# Patient Record
Sex: Male | Born: 1935 | Race: White | Hispanic: No
Health system: Southern US, Community
[De-identification: ages and names within clinical notes are randomized; demographics above are authoritative.]

---

## 2004-11-15 ENCOUNTER — Ambulatory Visit: Payer: Self-pay | Admitting: Internal Medicine

## 2004-11-23 ENCOUNTER — Ambulatory Visit: Payer: Self-pay | Admitting: Internal Medicine

## 2004-12-24 ENCOUNTER — Ambulatory Visit: Payer: Self-pay | Admitting: Internal Medicine

## 2005-01-23 ENCOUNTER — Ambulatory Visit: Payer: Self-pay | Admitting: Internal Medicine

## 2008-07-29 ENCOUNTER — Encounter: Payer: Self-pay | Admitting: Internal Medicine

## 2008-08-25 ENCOUNTER — Encounter: Payer: Self-pay | Admitting: Internal Medicine

## 2009-06-02 ENCOUNTER — Ambulatory Visit: Payer: Self-pay | Admitting: Internal Medicine

## 2009-11-19 ENCOUNTER — Inpatient Hospital Stay: Payer: Self-pay | Admitting: Internal Medicine

## 2009-12-22 ENCOUNTER — Inpatient Hospital Stay: Payer: Self-pay | Admitting: Internal Medicine

## 2010-01-24 ENCOUNTER — Inpatient Hospital Stay: Payer: Self-pay | Admitting: Internal Medicine

## 2010-02-12 ENCOUNTER — Inpatient Hospital Stay: Payer: Self-pay | Admitting: Internal Medicine

## 2011-02-09 IMAGING — CR DG CHEST 1V PORT
1 series · 1 of 1 positions shown · non-contrast
Comparison: none

REASON FOR EXAM: sob
COMMENTS:

PROCEDURE:     DXR - DXR PORTABLE CHEST SINGLE VIEW  - February 12, 2010  [DATE]
RESULT:     Comparison: 01/27/2010

[view not recorded]
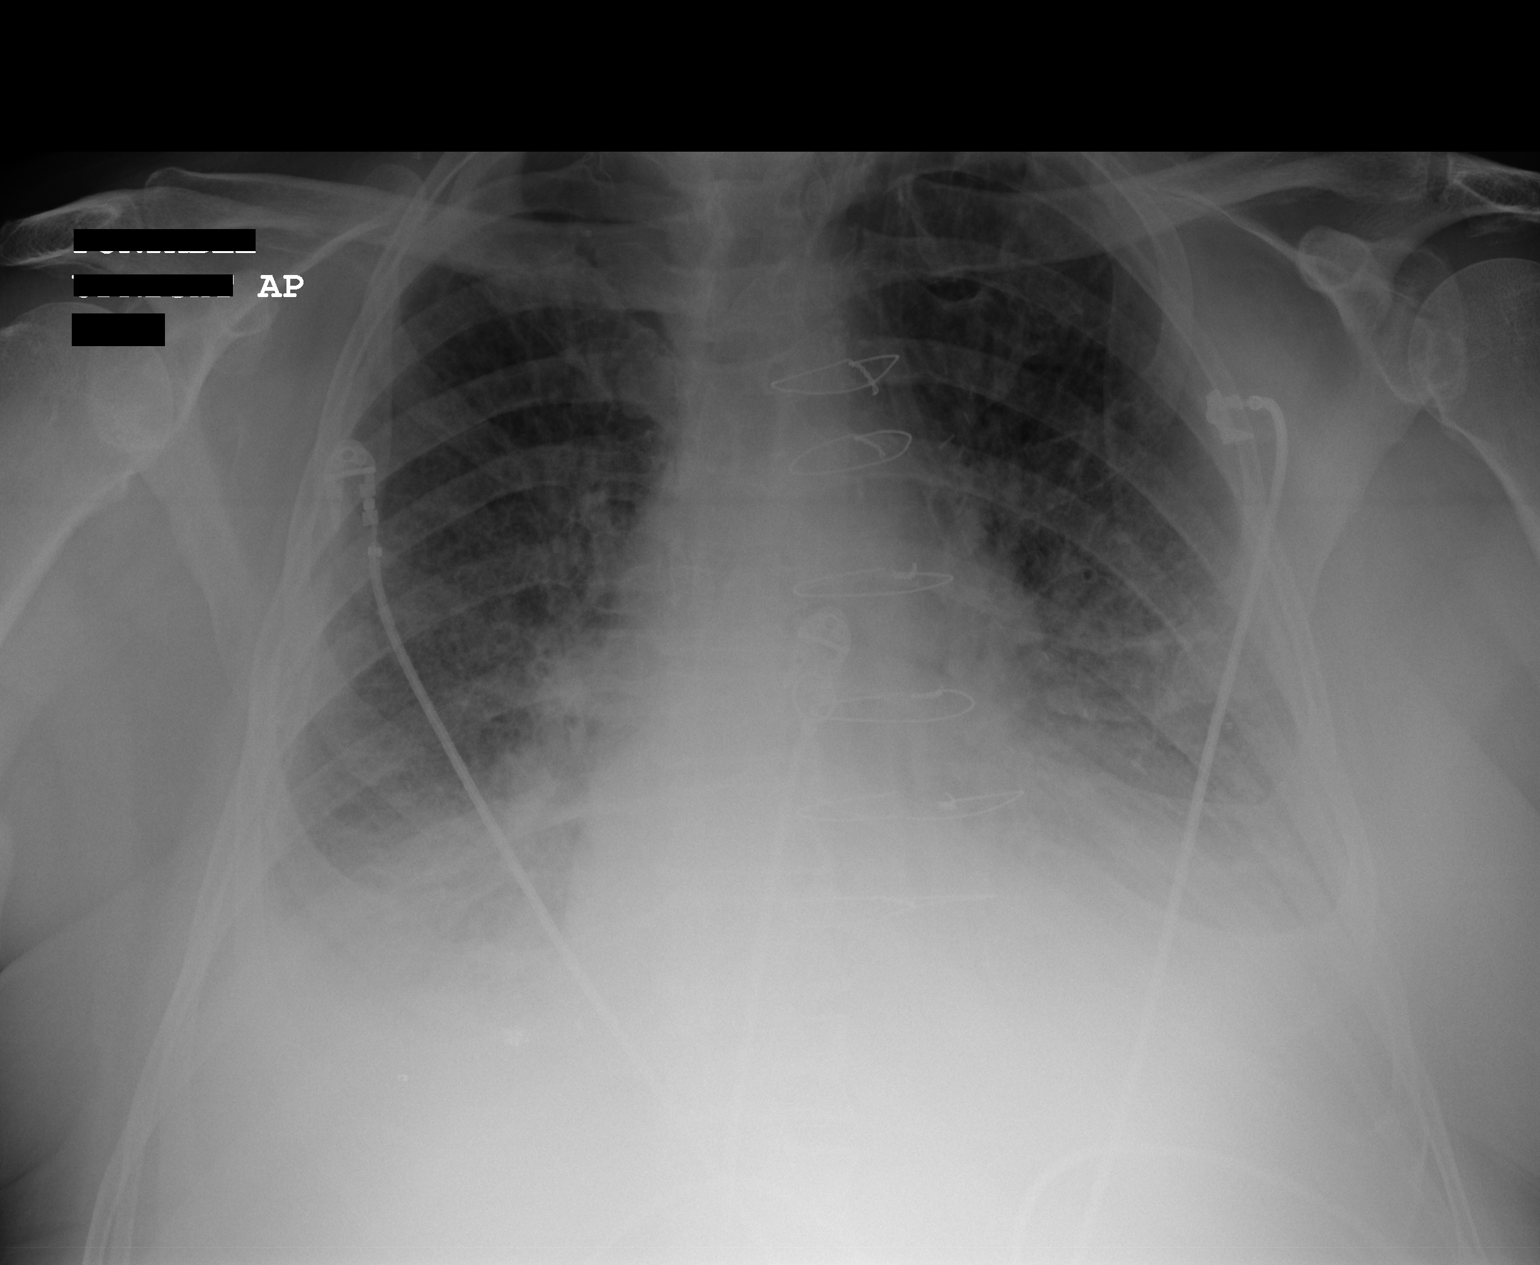

[1 of 1 positions shown; findings below may reference images not displayed]

FINDINGS: Single portable AP chest radiograph is provided. There bilateral small
pleural effusions. There is bilateral interstitial thickening and
indistinctness of the central pulmonary vasculature. The heart size is
enlarged. There is evidence of prior CABG. The osseous structures are
unremarkable.
IMPRESSION: The overall findings are concerning for pulmonary edema.

## 2011-02-10 IMAGING — CR DG CHEST 2V
1 series · 3 of 3 positions shown · non-contrast
Comparison: none

REASON FOR EXAM: CHF
COMMENTS:

[Series 1: view not recorded · 0.17mm/px · 3 of 3 slices shown]
[im 1/3]
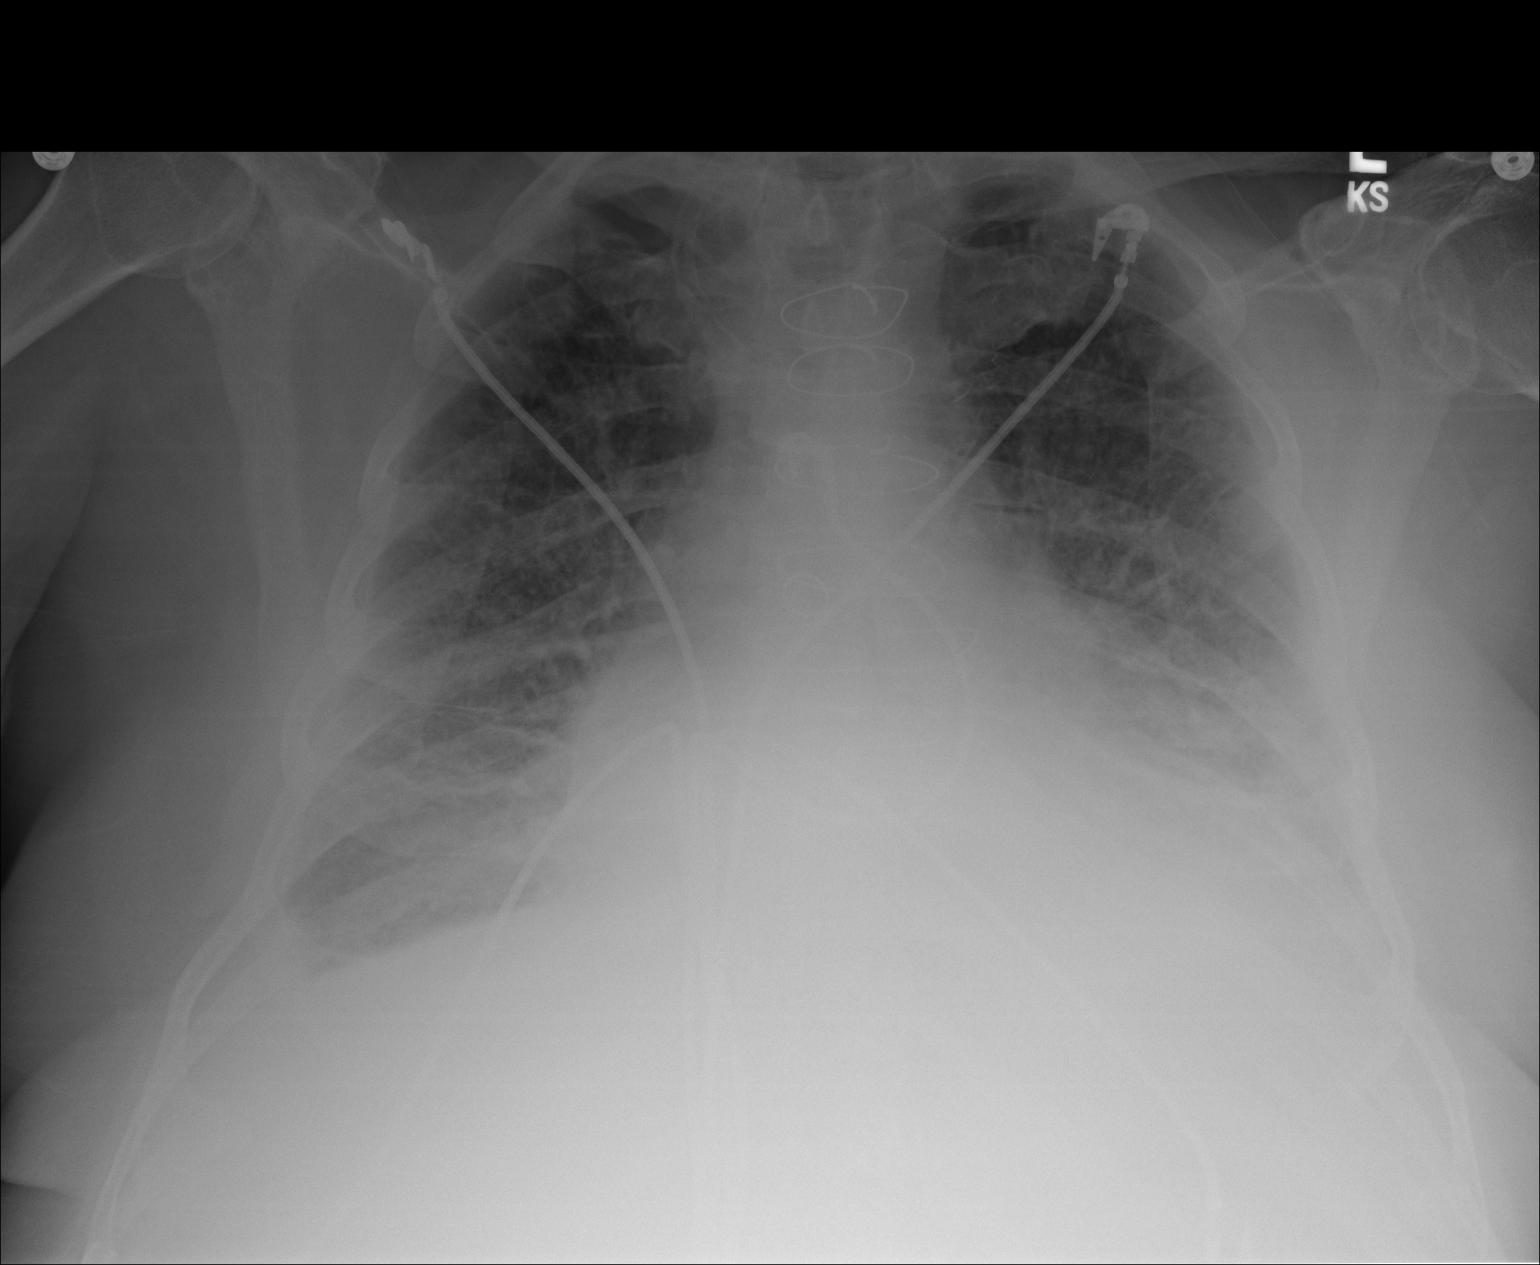
[im 2/3]
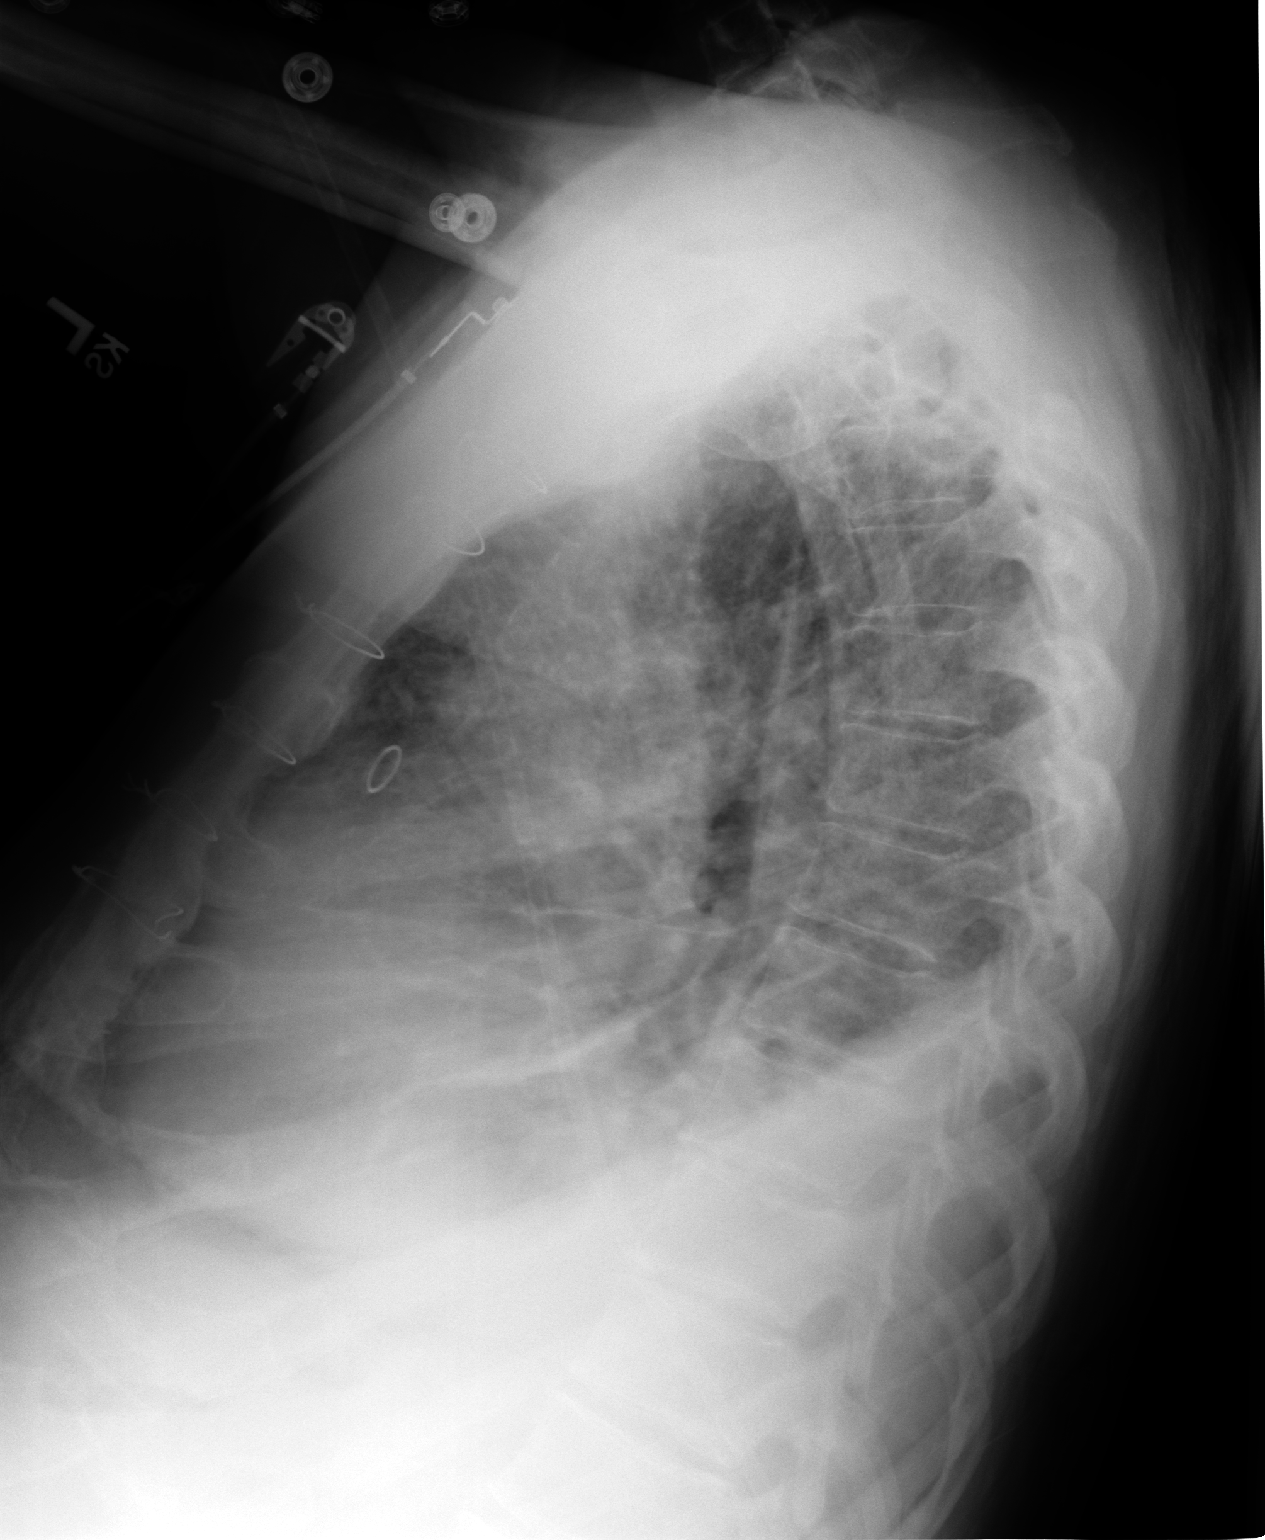
[im 3/3]
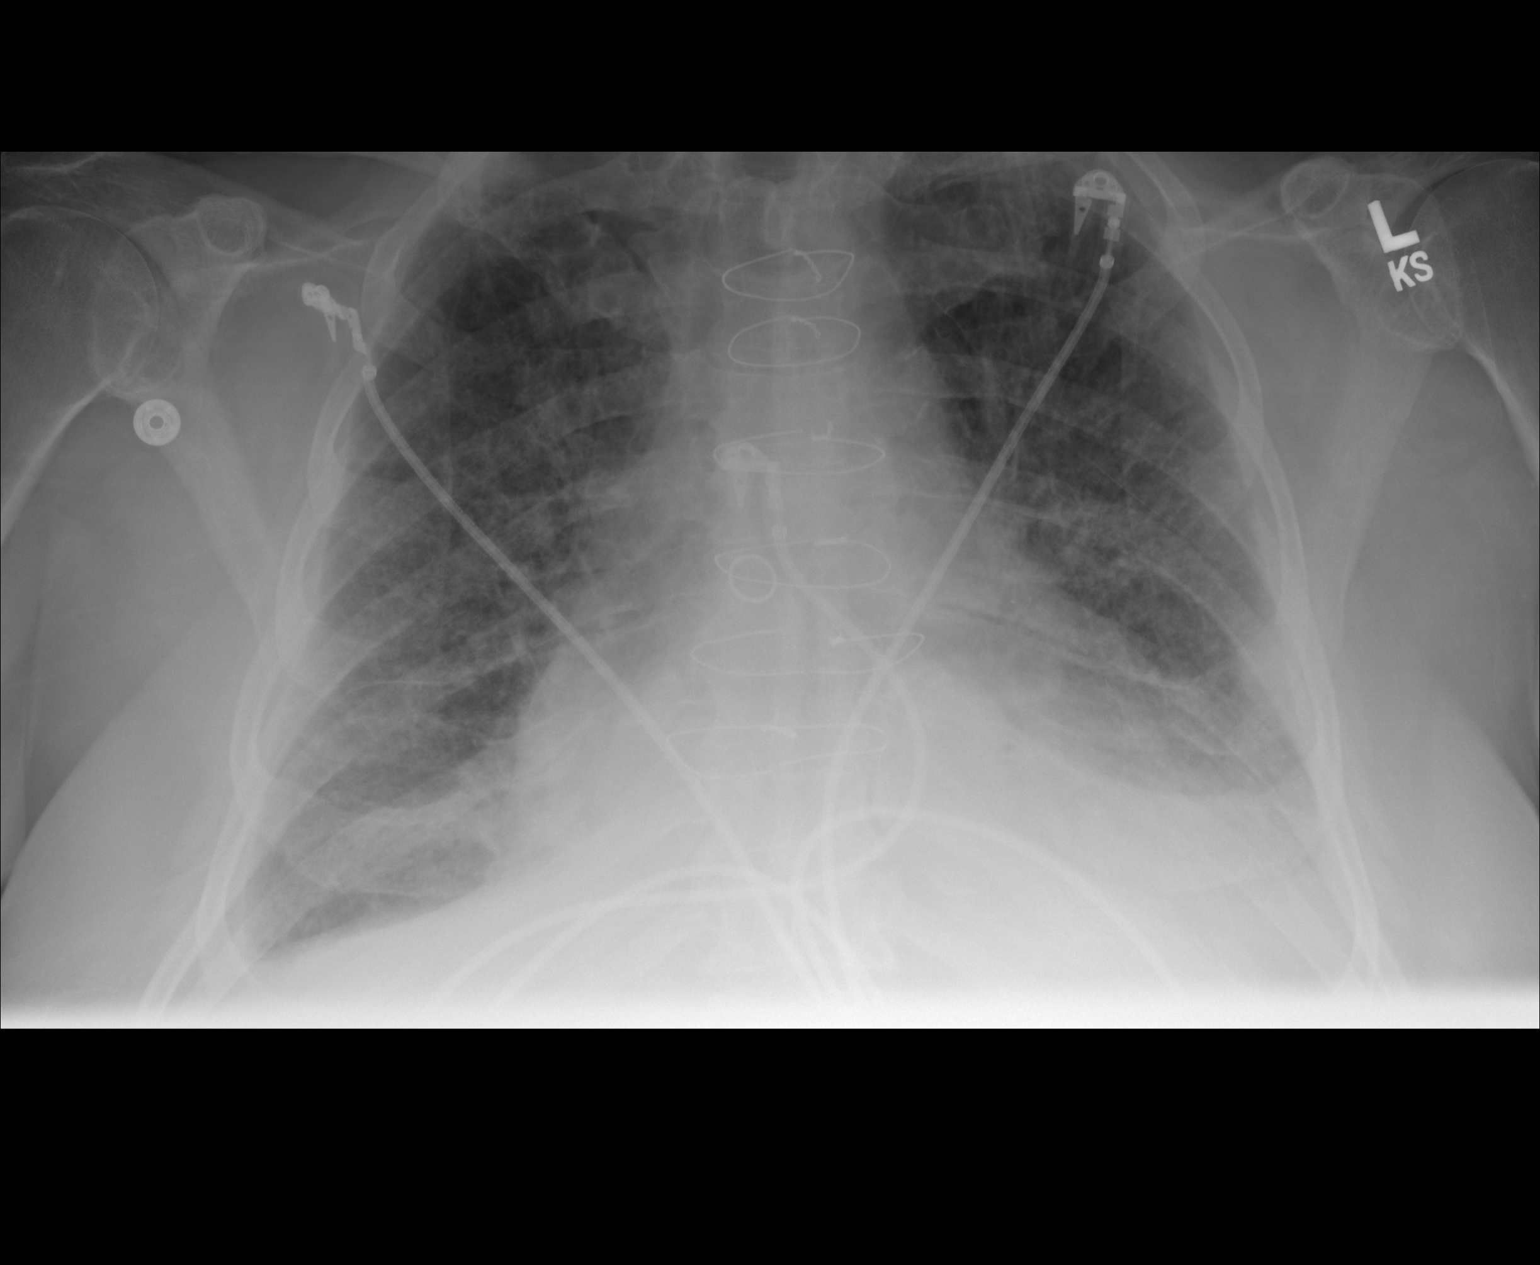

[3 of 3 positions shown; findings below may reference images not displayed]

PROCEDURE:     DXR - DXR CHEST PA (OR AP) AND LATERAL  - February 13, 2010  [DATE]

RESULT:     Comparison is made to study 13 February, 1999 the 11.

The cardiac silhouette is enlarged in its margins indistinct. The pulmonary
vascularity is engorged and indistinct and the interstitial markings are
increased. There is partial obscuration of the hemidiaphragms.
IMPRESSION: The findings are consistent with acute pulmonary
interstitial edema secondary to CHF. There has been some deterioration since
yesterday's study. There are pleural effusions layering posteriorly.

## 2013-01-31 ENCOUNTER — Inpatient Hospital Stay: Payer: Self-pay | Admitting: Internal Medicine

## 2013-01-31 LAB — CBC
HCT: 30.7 % — ABNORMAL LOW (ref 40.0–52.0)
HGB: 10.7 g/dL — ABNORMAL LOW (ref 13.0–18.0)
MCHC: 34.8 g/dL (ref 32.0–36.0)
RBC: 3.19 10*6/uL — ABNORMAL LOW (ref 4.40–5.90)
RDW: 15.3 % — ABNORMAL HIGH (ref 11.5–14.5)
WBC: 15.9 10*3/uL — ABNORMAL HIGH (ref 3.8–10.6)

## 2013-01-31 LAB — COMPREHENSIVE METABOLIC PANEL
Albumin: 3.4 g/dL (ref 3.4–5.0)
BUN: 45 mg/dL — ABNORMAL HIGH (ref 7–18)
Bilirubin,Total: 0.5 mg/dL (ref 0.2–1.0)
Calcium, Total: 8.5 mg/dL (ref 8.5–10.1)
Chloride: 94 mmol/L — ABNORMAL LOW (ref 98–107)
Co2: 21 mmol/L (ref 21–32)
Creatinine: 2.16 mg/dL — ABNORMAL HIGH (ref 0.60–1.30)
EGFR (Non-African Amer.): 28 — ABNORMAL LOW
Glucose: 137 mg/dL — ABNORMAL HIGH (ref 65–99)
Osmolality: 265 (ref 275–301)
SGPT (ALT): 30 U/L (ref 12–78)
Sodium: 125 mmol/L — ABNORMAL LOW (ref 136–145)
Total Protein: 7.7 g/dL (ref 6.4–8.2)

## 2013-01-31 LAB — URINALYSIS, COMPLETE
Blood: NEGATIVE
Glucose,UR: NEGATIVE mg/dL (ref 0–75)
Hyaline Cast: 22
Ketone: NEGATIVE
Nitrite: NEGATIVE
Ph: 8 (ref 4.5–8.0)
RBC,UR: 1 /HPF (ref 0–5)
Specific Gravity: 1.012 (ref 1.003–1.030)
Squamous Epithelial: NONE SEEN
WBC UR: 5 /HPF (ref 0–5)

## 2013-01-31 LAB — CK TOTAL AND CKMB (NOT AT ARMC)
CK, Total: 86 U/L (ref 35–232)
CK-MB: 1.9 ng/mL (ref 0.5–3.6)

## 2013-01-31 LAB — APTT: Activated PTT: 41.1 secs — ABNORMAL HIGH (ref 23.6–35.9)

## 2013-01-31 LAB — TROPONIN I: Troponin-I: 0.88 ng/mL — ABNORMAL HIGH

## 2013-02-01 LAB — LIPID PANEL
HDL Cholesterol: 42 mg/dL (ref 40–60)
Ldl Cholesterol, Calc: 59 mg/dL (ref 0–100)
Triglycerides: 74 mg/dL (ref 0–200)
VLDL Cholesterol, Calc: 15 mg/dL (ref 5–40)

## 2013-02-01 LAB — CBC WITH DIFFERENTIAL/PLATELET
Basophil %: 0.2 %
Eosinophil #: 0.1 10*3/uL (ref 0.0–0.7)
Eosinophil %: 0.5 %
HGB: 10.5 g/dL — ABNORMAL LOW (ref 13.0–18.0)
Lymphocyte #: 1.4 10*3/uL (ref 1.0–3.6)
MCH: 34.2 pg — ABNORMAL HIGH (ref 26.0–34.0)
MCHC: 35.5 g/dL (ref 32.0–36.0)
MCV: 96 fL (ref 80–100)
Monocyte #: 1.3 x10 3/mm — ABNORMAL HIGH (ref 0.2–1.0)
Monocyte %: 9 %
Neutrophil #: 11.6 10*3/uL — ABNORMAL HIGH (ref 1.4–6.5)
Neutrophil %: 80.4 %
Platelet: 162 10*3/uL (ref 150–440)
RBC: 3.07 10*6/uL — ABNORMAL LOW (ref 4.40–5.90)
RDW: 15.5 % — ABNORMAL HIGH (ref 11.5–14.5)
WBC: 14.4 10*3/uL — ABNORMAL HIGH (ref 3.8–10.6)

## 2013-02-01 LAB — BASIC METABOLIC PANEL
Anion Gap: 7 (ref 7–16)
BUN: 47 mg/dL — ABNORMAL HIGH (ref 7–18)
Chloride: 96 mmol/L — ABNORMAL LOW (ref 98–107)
Co2: 23 mmol/L (ref 21–32)
EGFR (Non-African Amer.): 30 — ABNORMAL LOW
Osmolality: 268 (ref 275–301)
Sodium: 126 mmol/L — ABNORMAL LOW (ref 136–145)

## 2013-02-01 LAB — TROPONIN I
Troponin-I: 1.8 ng/mL — ABNORMAL HIGH
Troponin-I: 1.5 ng/mL — ABNORMAL HIGH
Troponin-I: 1.1 ng/mL — ABNORMAL HIGH

## 2013-02-01 LAB — MAGNESIUM: Magnesium: 2.1 mg/dL

## 2013-02-01 LAB — TSH: Thyroid Stimulating Horm: 3.86 u[IU]/mL

## 2013-02-02 LAB — BASIC METABOLIC PANEL
Anion Gap: 7 (ref 7–16)
BUN: 40 mg/dL — ABNORMAL HIGH (ref 7–18)
Calcium, Total: 8.2 mg/dL — ABNORMAL LOW (ref 8.5–10.1)
Chloride: 102 mmol/L (ref 98–107)
Co2: 23 mmol/L (ref 21–32)
Creatinine: 1.74 mg/dL — ABNORMAL HIGH (ref 0.60–1.30)
EGFR (African American): 43 — ABNORMAL LOW
EGFR (Non-African Amer.): 37 — ABNORMAL LOW
Glucose: 152 mg/dL — ABNORMAL HIGH (ref 65–99)
Osmolality: 277 (ref 275–301)
Potassium: 4.4 mmol/L (ref 3.5–5.1)
Sodium: 132 mmol/L — ABNORMAL LOW (ref 136–145)

## 2013-02-02 LAB — CBC WITH DIFFERENTIAL/PLATELET
Basophil #: 0 10*3/uL (ref 0.0–0.1)
Eosinophil #: 0.1 10*3/uL (ref 0.0–0.7)
Eosinophil %: 1 %
HCT: 30.4 % — ABNORMAL LOW (ref 40.0–52.0)
Lymphocyte #: 1.2 10*3/uL (ref 1.0–3.6)
Lymphocyte %: 10.5 %
MCH: 34.1 pg — ABNORMAL HIGH (ref 26.0–34.0)
MCHC: 35.2 g/dL (ref 32.0–36.0)
MCV: 97 fL (ref 80–100)
Monocyte #: 1 x10 3/mm (ref 0.2–1.0)
Monocyte %: 8.5 %
Neutrophil #: 9.3 10*3/uL — ABNORMAL HIGH (ref 1.4–6.5)
Neutrophil %: 79.7 %
Platelet: 162 10*3/uL (ref 150–440)
RBC: 3.14 10*6/uL — ABNORMAL LOW (ref 4.40–5.90)
WBC: 11.7 10*3/uL — ABNORMAL HIGH (ref 3.8–10.6)

## 2013-02-06 LAB — CULTURE, BLOOD (SINGLE)

## 2013-02-23 ENCOUNTER — Ambulatory Visit: Payer: Self-pay | Admitting: Internal Medicine

## 2013-02-25 LAB — CBC
HCT: 31.7 % — ABNORMAL LOW (ref 40.0–52.0)
HGB: 10.9 g/dL — ABNORMAL LOW (ref 13.0–18.0)
MCH: 33.3 pg (ref 26.0–34.0)
MCHC: 34.3 g/dL (ref 32.0–36.0)
MCV: 97 fL (ref 80–100)
Platelet: 176 10*3/uL (ref 150–440)
RDW: 15.8 % — ABNORMAL HIGH (ref 11.5–14.5)
WBC: 9.7 10*3/uL (ref 3.8–10.6)

## 2013-02-25 LAB — PRO B NATRIURETIC PEPTIDE: B-Type Natriuretic Peptide: 13760 pg/mL — ABNORMAL HIGH (ref 0–450)

## 2013-02-25 LAB — URINALYSIS, COMPLETE
Bilirubin,UR: NEGATIVE
Glucose,UR: NEGATIVE mg/dL (ref 0–75)
Ketone: NEGATIVE
Protein: NEGATIVE
RBC,UR: <1 /HPF (ref 0–5)
Squamous Epithelial: <1

## 2013-02-25 LAB — CK TOTAL AND CKMB (NOT AT ARMC): CK, Total: 43 U/L (ref 35–232)

## 2013-02-25 LAB — COMPREHENSIVE METABOLIC PANEL
Albumin: 3.4 g/dL (ref 3.4–5.0)
Alkaline Phosphatase: 57 U/L (ref 50–136)
Anion Gap: 6 — ABNORMAL LOW (ref 7–16)
BUN: 30 mg/dL — ABNORMAL HIGH (ref 7–18)
Bilirubin,Total: 0.6 mg/dL (ref 0.2–1.0)
Calcium, Total: 8.4 mg/dL — ABNORMAL LOW (ref 8.5–10.1)
Chloride: 91 mmol/L — ABNORMAL LOW (ref 98–107)
EGFR (Non-African Amer.): 37 — ABNORMAL LOW
Glucose: 134 mg/dL — ABNORMAL HIGH (ref 65–99)
Potassium: 4.2 mmol/L (ref 3.5–5.1)
SGPT (ALT): 22 U/L (ref 12–78)
Sodium: 125 mmol/L — ABNORMAL LOW (ref 136–145)
Total Protein: 7.7 g/dL (ref 6.4–8.2)

## 2013-02-26 ENCOUNTER — Inpatient Hospital Stay: Payer: Self-pay | Admitting: Internal Medicine

## 2013-02-26 LAB — CBC WITH DIFFERENTIAL/PLATELET
Basophil %: 0.6 %
Eosinophil #: 0.1 10*3/uL (ref 0.0–0.7)
HCT: 30.1 % — ABNORMAL LOW (ref 40.0–52.0)
HGB: 10.5 g/dL — ABNORMAL LOW (ref 13.0–18.0)
Lymphocyte #: 1.4 10*3/uL (ref 1.0–3.6)
MCH: 33.7 pg (ref 26.0–34.0)
Monocyte #: 0.8 x10 3/mm (ref 0.2–1.0)
Neutrophil #: 4.7 10*3/uL (ref 1.4–6.5)
Platelet: 159 10*3/uL (ref 150–440)
RBC: 3.11 10*6/uL — ABNORMAL LOW (ref 4.40–5.90)
RDW: 16.1 % — ABNORMAL HIGH (ref 11.5–14.5)

## 2013-02-26 LAB — COMPREHENSIVE METABOLIC PANEL
Alkaline Phosphatase: 50 U/L (ref 50–136)
Anion Gap: 4 — ABNORMAL LOW (ref 7–16)
BUN: 32 mg/dL — ABNORMAL HIGH (ref 7–18)
Bilirubin,Total: 0.4 mg/dL (ref 0.2–1.0)
Creatinine: 1.9 mg/dL — ABNORMAL HIGH (ref 0.60–1.30)
EGFR (African American): 39 — ABNORMAL LOW
EGFR (Non-African Amer.): 33 — ABNORMAL LOW
Glucose: 113 mg/dL — ABNORMAL HIGH (ref 65–99)
Osmolality: 259 (ref 275–301)
Potassium: 4.1 mmol/L (ref 3.5–5.1)
SGOT(AST): 17 U/L (ref 15–37)
SGPT (ALT): 20 U/L (ref 12–78)
Sodium: 125 mmol/L — ABNORMAL LOW (ref 136–145)

## 2013-02-26 LAB — TSH: Thyroid Stimulating Horm: 6.27 u[IU]/mL — ABNORMAL HIGH

## 2013-02-26 LAB — CK-MB: CK-MB: 0.8 ng/mL (ref 0.5–3.6)

## 2013-02-27 LAB — BASIC METABOLIC PANEL
Anion Gap: 3 — ABNORMAL LOW (ref 7–16)
Co2: 29 mmol/L (ref 21–32)
Creatinine: 1.58 mg/dL — ABNORMAL HIGH (ref 0.60–1.30)
EGFR (African American): 48 — ABNORMAL LOW
Glucose: 123 mg/dL — ABNORMAL HIGH (ref 65–99)
Sodium: 127 mmol/L — ABNORMAL LOW (ref 136–145)

## 2013-02-28 LAB — BASIC METABOLIC PANEL
Calcium, Total: 8.7 mg/dL (ref 8.5–10.1)
Chloride: 97 mmol/L — ABNORMAL LOW (ref 98–107)
Co2: 28 mmol/L (ref 21–32)
EGFR (African American): 45 — ABNORMAL LOW
EGFR (Non-African Amer.): 39 — ABNORMAL LOW
Osmolality: 268 (ref 275–301)
Potassium: 4.6 mmol/L (ref 3.5–5.1)

## 2013-03-15 ENCOUNTER — Inpatient Hospital Stay: Payer: Self-pay | Admitting: Student

## 2013-03-15 LAB — URINALYSIS, COMPLETE
Bilirubin,UR: NEGATIVE
Glucose,UR: NEGATIVE mg/dL (ref 0–75)
Hyaline Cast: 3
Ketone: NEGATIVE
Nitrite: NEGATIVE
Ph: 5 (ref 4.5–8.0)
Protein: 30
RBC,UR: 11 /HPF (ref 0–5)
Specific Gravity: 1.015 (ref 1.003–1.030)
Squamous Epithelial: <1
WBC UR: 10 /HPF (ref 0–5)

## 2013-03-15 LAB — COMPREHENSIVE METABOLIC PANEL
Albumin: 3.6 g/dL (ref 3.4–5.0)
Anion Gap: 6 — ABNORMAL LOW (ref 7–16)
Calcium, Total: 8.6 mg/dL (ref 8.5–10.1)
Chloride: 92 mmol/L — ABNORMAL LOW (ref 98–107)
Creatinine: 1.76 mg/dL — ABNORMAL HIGH (ref 0.60–1.30)
EGFR (African American): 42 — ABNORMAL LOW
EGFR (Non-African Amer.): 36 — ABNORMAL LOW
Glucose: 169 mg/dL — ABNORMAL HIGH (ref 65–99)
Potassium: 6 mmol/L — ABNORMAL HIGH (ref 3.5–5.1)
SGOT(AST): 75 U/L — ABNORMAL HIGH (ref 15–37)
SGPT (ALT): 85 U/L — ABNORMAL HIGH (ref 12–78)
Sodium: 122 mmol/L — ABNORMAL LOW (ref 136–145)
Total Protein: 7.4 g/dL (ref 6.4–8.2)

## 2013-03-15 LAB — CK TOTAL AND CKMB (NOT AT ARMC)
CK, Total: 77 U/L (ref 35–232)
CK, Total: 61 U/L (ref 35–232)

## 2013-03-15 LAB — PROTIME-INR: INR: 1.2

## 2013-03-15 LAB — CBC
HCT: 33.2 % — ABNORMAL LOW (ref 40.0–52.0)
RDW: 16.8 % — ABNORMAL HIGH (ref 11.5–14.5)

## 2013-03-15 LAB — TROPONIN I: Troponin-I: 0.64 ng/mL — ABNORMAL HIGH

## 2013-03-16 LAB — CBC WITH DIFFERENTIAL/PLATELET
Basophil #: 0 10*3/uL (ref 0.0–0.1)
Basophil %: 0.3 %
Eosinophil #: 0.1 10*3/uL (ref 0.0–0.7)
Eosinophil %: 0.8 %
HCT: 30.5 % — ABNORMAL LOW (ref 40.0–52.0)
HGB: 10.3 g/dL — ABNORMAL LOW (ref 13.0–18.0)
Lymphocyte %: 11.3 %
MCH: 32.5 pg (ref 26.0–34.0)
MCHC: 33.7 g/dL (ref 32.0–36.0)
Monocyte %: 11.1 %
Neutrophil %: 76.5 %
Platelet: 167 10*3/uL (ref 150–440)
RBC: 3.17 10*6/uL — ABNORMAL LOW (ref 4.40–5.90)

## 2013-03-16 LAB — BASIC METABOLIC PANEL
BUN: 23 mg/dL — ABNORMAL HIGH (ref 7–18)
Calcium, Total: 8.6 mg/dL (ref 8.5–10.1)
Chloride: 92 mmol/L — ABNORMAL LOW (ref 98–107)
Co2: 27 mmol/L (ref 21–32)
Creatinine: 1.85 mg/dL — ABNORMAL HIGH (ref 0.60–1.30)
EGFR (African American): 40 — ABNORMAL LOW
Sodium: 126 mmol/L — ABNORMAL LOW (ref 136–145)

## 2013-03-16 LAB — CK TOTAL AND CKMB (NOT AT ARMC)
CK, Total: 54 U/L (ref 35–232)
CK-MB: 2.5 ng/mL (ref 0.5–3.6)

## 2013-03-16 LAB — TROPONIN I: Troponin-I: 0.56 ng/mL — ABNORMAL HIGH

## 2013-03-16 LAB — POTASSIUM: Potassium: 5 mmol/L (ref 3.5–5.1)

## 2013-03-17 LAB — BASIC METABOLIC PANEL
Anion Gap: 6 — ABNORMAL LOW (ref 7–16)
BUN: 23 mg/dL — ABNORMAL HIGH (ref 7–18)
Calcium, Total: 8.3 mg/dL — ABNORMAL LOW (ref 8.5–10.1)
Chloride: 88 mmol/L — ABNORMAL LOW (ref 98–107)
Co2: 28 mmol/L (ref 21–32)
Creatinine: 1.66 mg/dL — ABNORMAL HIGH (ref 0.60–1.30)
EGFR (African American): 45 — ABNORMAL LOW
Osmolality: 252 (ref 275–301)
Potassium: 4.7 mmol/L (ref 3.5–5.1)
Sodium: 122 mmol/L — ABNORMAL LOW (ref 136–145)

## 2013-03-18 LAB — BASIC METABOLIC PANEL
Anion Gap: 8 (ref 7–16)
Calcium, Total: 8.2 mg/dL — ABNORMAL LOW (ref 8.5–10.1)
Chloride: 89 mmol/L — ABNORMAL LOW (ref 98–107)
Co2: 26 mmol/L (ref 21–32)
Creatinine: 1.48 mg/dL — ABNORMAL HIGH (ref 0.60–1.30)
EGFR (African American): 52 — ABNORMAL LOW
EGFR (Non-African Amer.): 45 — ABNORMAL LOW
Glucose: 144 mg/dL — ABNORMAL HIGH (ref 65–99)
Osmolality: 253 (ref 275–301)
Potassium: 4.5 mmol/L (ref 3.5–5.1)
Sodium: 123 mmol/L — ABNORMAL LOW (ref 136–145)

## 2013-03-25 ENCOUNTER — Ambulatory Visit: Payer: Self-pay | Admitting: Internal Medicine

## 2013-04-25 DEATH — deceased

## 2015-01-15 NOTE — H&P (Signed)
PATIENT NAME:  [name], TOPPER MR#:  [id number] DATE OF BIRTH:  Oct 25, 1933  DATE OF ADMISSION:  03/15/2013  REFERRING PHYSICIAN: Caleen Jobs. Braud, MD  PRIMARY CARE PHYSICIAN: None.  CHIEF COMPLAINT: Weakness.   HISTORY OF PRESENT ILLNESS: The patient is a pleasant 81 year old male who uses hospice services at home, with recent hospitalizations for syncopal episode, NSTEMI, who is brought in by family. The patient has multiple medical issues, including chronic respiratory failure, on 7 liters of oxygen, severe pulmonary hypertension and systolic CHF, CKD. He has had a couple of days of chest pain without radiation. Today, he felt weak and had difficulty transferring and was brought in by family for that. Here, he was found to have multiple lab abnormalities, including worsening sodium and potassium of 6 and was asked to be evaluated by the ER physician. The patient describes shortness of breath, increased lower extremity edema. There are multiple family members in the room. The patient has also a whitish productive cough for the last few days. He also has a positive troponin.   PAST MEDICAL HISTORY:  1. Ischemic dilated and valvular cardiomyopathy with EF of 45% to 50%, severe tricuspid regurgitation with severe pulmonary hypertension.  2. History of atherosclerotic cardiovascular disease, status post CABG.  3. Morbid obesity.  4. Diabetes.  5. Hypertension.  6. Hyperlipidemia.  7. Benign prostatic hypertrophy with chronic indwelling Foley catheter.  8. Chronic atrial fibrillation.  9. History of cellulitis. 10. CKD, stage III.  11. Previous tobacco abuse.  12. Chronic respiratory failure, on 7 liters of oxygen.  13. History of multiple falls, under hospice care.   ALLERGIES: SULFA.   SOCIAL HISTORY: No tobacco, alcohol or drug use currently. Is being taken care of by hospice at home.  FAMILY HISTORY: Daughter with diabetes and CAD.   OUTPATIENT MEDICATIONS:  1. Aspirin 81 mg  daily. 2. Cipro 250 mg 2 times a day for 7 days. 3. Glimepiride 2 mg once a day. 4. Xopenex 0.63 mg/3 mL 1 vial every 6 hours as needed for shortness of breath.  5. Loratadine 10 mg daily.  6. Lorazepam 0.5 mg 1 to 2 tabs every 6 to 8 hours as needed for anxiety.  7. Morphine extended release 30 mg every 12 hours.  8. Omeprazole 20 mg daily. 9. MiraLax 17 grams once a day. 10. Roxanol 0.5 to 1 mg every 1 to 2 hours as needed for shortness of breath.   REVIEW OF SYSTEMS:  CONSTITUTIONAL: No fever or fatigue. Positive for sweats today and generalized weakness.  EYES: No blurry vision or double vision.  ENT: No tinnitus or hearing loss.  RESPIRATORY: Positive for cough, also chronic oxygen-dependent respiratory failure, increased shortness of breath on baseline.  CARDIOVASCULAR: Chest pain as above, without radiation ,for a couple of days. Increased lower extremity edema. History of atrial fibrillation and heart failure.  GASTROINTESTINAL: No nausea, vomiting, constipation or diarrhea. No bloody stools.  GENITOURINARY: Chronic Foley.  ENDOCRINE: No polyuria or nocturia.  HEMATOLOGIC AND LYMPHATIC: No anemia or easy bruising.  SKIN: No rashes.  MUSCULOSKELETAL: Denies swelling in joints or arthritis.  PSYCHIATRIC: No anxiety or insomnia.   PHYSICAL EXAMINATION:  VITAL SIGNS: Temperature on arrival 97.3, pulse rate 84, respiratory rate 18, blood pressure 136/58, O2 saturation 90% on oxygen.  GENERAL: The patient is a pale, chronically ill-appearing Caucasian obese male lying in bed in no obvious distress.  HEENT: Normocephalic, atraumatic. Pupils are equal and reactive. Anicteric sclerae. Dry mucous membranes.  NECK: Supple. No  thyroid tenderness, distended neck veins and JVD.  CARDIOVASCULAR: S1 and S2, irregularly irregular, positive murmur.  LUNGS: Bilateral basilar crackles, good air entry otherwise. No rales.  ABDOMEN: Soft, nontender, nondistended. Positive bowel sounds in all  quadrants.  EXTREMITIES: 2+ edema to mid shins.  SKIN: No obvious rashes.  PSYCHIATRIC: Awake, alert and oriented x2, cooperative, pleasant.   LABORATORY DATA: Glucose 169, BUN 22, creatinine 1.76, which is apparently at his baseline, sodium 122, of note he was 131 on June 6th, potassium 6, chloride 92. LFTs show AST of 75, ALT is 85, otherwise within normal limits. Troponin positive at 0.64. CK-MB is 3.2. WBC 10.5, hemoglobin 11.6, platelets 193. INR is 1.2 ABG showed pH of 7.32, pCO2 of 42, pO2 of 54.   ELECTROCARDIOGRAM: Atrial fibrillation, rate is 80, incomplete right bundle branch, ST and T wave abnormalities in inferior as well as anterior lateral leads. No acute ST elevations.   IMAGING: X-ray of the chest, PA and lateral, today shows interstitial infiltrate, likely representing pulmonary edema. No focal consolidation.   ASSESSMENT AND PLAN: We have a 81 year old male with multiorgan failure, including chronic respiratory failure, on 7 liters of oxygen, chronic kidney disease stage III, chronic systolic congestive heart failure, severe pulmonary hypertension and recent hospitalizations, with hospice coverage at home, who presents with weakness and was found with positive troponin, hyperkalemia, hyponatremia, acute on chronic systolic congestive heart failure. At this point, I will admit the patient to the hospital on telemetry. In regards to the weakness, will obtain a physical therapy consult. In regards to the positive troponin, this could be non-ST elevation myocardial infarction versus demand ischemia. The patient has chronic respiratory failure as well as renal failure. He describes chest pain and shortness of breath, and there are EKG changes implying ischemic disease, which he is known to have. Will start the patient on aspirin, cycle the troponins, admit the patient to telemetry and obtain a cardiology consult. It is unclear what could be done for the patient at this point with his chronic  kidney disease and pulmonary hypertension and being on hospice. Overall prognosis appears to be poor. Would start the patient on IV morphine p.r.n. for pain control as well as nitro paste. The hyperkalemia has been treated in the ER with Kayexalate, calcium, bicarbonate and insulin. I would recheck a potassium this afternoon and also in the morning. The patient appears to be in acute on chronic systolic congestive heart failure. Given the renal dysfunction and electrolyte abnormalities, I would be careful with diuresis. He has been given 40 of Lasix in the ER, and I would re-dose perhaps tomorrow after checking electrolytes. His hyponatremia likely is secondary to acute on chronic systolic congestive heart failure. Would monitor that with diuresis and see if it improves. Would continue his oxygen. He is on high FIO2  oxygen at home. He has chronic respiratory failure, likely multifactorial from pulmonary hypertension, congestive heart failure. Would place palliative care and hospice consults tomorrow for the patient to be seen on Monday.   The overall poor prognosis was discussed with the patient. It is possible the patient needs a higher level of care, including SNF with hospice, but also given the overall poor prognosis, perhaps the patient can go home with hospice and no further hospitalization, and the family is aware and will consider that option as well.   CODE STATUS: The patient is DNR.   TOTAL TIME SPENT: 65 minutes.   ____________________________ Krystal EatonShayiq Prerna Harold, MD sa:OSi D: 03/15/2013 12:58:15 ET  T: 03/15/2013 13:55:33 ET JOB#: [id number]  cc: Krystal Eaton, MD, <Dictator> Krystal Eaton MD ELECTRONICALLY SIGNED 03/23/2013 22:17

## 2015-01-15 NOTE — H&P (Signed)
PATIENT NAME:  [name], [name] MR#:  [id number] DATE OF BIRTH:  03-09-1934  DATE OF ADMISSION:  02/25/2013  PRIMARY CARE PHYSICIAN: [facility]. The patient does not have  any regular primary care physician though.   HISTORY OF PRESENT ILLNESS:  The patient is a 81 year old Caucasian male with past medical history significant for history of multiple medical problems including ischemic as well as valvular cardiomyopathy with ejection fraction of 45% to 50% on the most recent echo, on the 10th of May 2014, also severe tricuspid regurgitation, who is home oxygen dependent, on 7 liters of oxygen through nasal cannula at home, who presents to the hospital with complaints of weakness and inability to walk.  According to the patient, he was doing well up until yesterday afternoon. He was able to walk, however, yesterday in the afternoon he became so weak he was not able to move around. He also admitted of having some chest pains yesterday. Pain started a few days ago. It would come and go.  Had 1 episode yesterday as well as 1 episode the day before yesterday. Pain is in lower left chest area. It is described as achiness, lasts approximately 10 minutes with no radiation and would go away. Today the patient was also noticing some shortness of breath. Because of this pain as well as shortness of breath as well as significant weakness he decided to come to the Emergency Room for further evaluation. He tells me his weight is about stable. It is around 245 pounds. He did not notice any lower extremity swelling or upper extremity swelling. He admits of having some cough. Admits of having some 2 pillow orthopnea which seems to be chronic and today he also felt somewhat presyncopal and feeling cold and fatigue.  He decided to come to the Emergency Room for further evaluation. In the Emergency Room, he was noted to be hypertensive. He was also noted to have mild elevation in troponin and hospitalist  services were contacted for admission.   PAST MEDICAL HISTORY: Significant for history of recent admission in May 2014 for non-ST elevation MI which was felt to be due to hypertension as well as urinary tract infection. Also urinary tract infection due to chronic Foley catheter, hypertension, systolic heart failure which was chronic, acute on chronic renal failure. Also prerenal azotemia due to dehydration, hyponatremia, history of falls and hospice care.  Past medical history is also significant for coronary artery disease status post coronary artery bypass grafting, morbid obesity, diabetes mellitus type 2, hypertension, hyperlipidemia, BPH, chronic atrial fibrillation, history of right lower extremity cellulitis and previous tobacco abuse.   ALLERGIES: SULFA.   MEDICATIONS: According to medical records, the patient is on: 1.  Tylenol 650 mg p.o. every 8 hours as needed. 2.  Aspirin 81 mg p.o. daily. 3.  Bumetanide 1 mg p.o. 3 times daily. 4.  Carvedilol 3.125 mg p.o. twice daily. 5.  Cipro 500 mg p.o. 1/2 tablet once a day. 6.  Glimepiride 2 mg p.o. once daily. 7.  Levalbuterol 0.63 mg in 3 mL inhalation solution every 6 hours as needed. 8.  Lorazepam 0.5 mg 1 tablet every 6 to 8 hours as needed. 9.  Morphine 50 mg extended-release twice daily. 10.  Roxanol 0.5 to 1 mg every 1 to 2 hours as needed.  11.  Simvastatin 10 mg p.o. once at bedtime. 12.  Spironolactone 100 mg p.o. twice daily.    NOTE:  However, the patient's medications were recently adjusted and it is  unclear which medications he is using now.  SOCIAL HISTORY: No tobacco abuse recently.  No history of alcohol in patient who is on hospice for the patient 2 years.  He is using oxygen 7 liters of oxygen through nasal cannula as well as chronic Foley catheter.   FAMILY HISTORY: Daughter has coronary artery disease as well as diabetes.   REVIEW OF SYSTEMS: Positive for feeling cold for the past few days, fatigue and weak since  yesterday evening. Pains in the chest for the past 1 to 2 days, lasting for a few minutes and going away.  Relatively stable weight. Some cough as well as whitish phlegm production which seems to be thick, but very small amount and very intermittently only.  Some shortness of breath on exertion. Also orthopnea, which is 2 pillow orthopnea, which is chronic. Chronic lower extremity edema, which does not seem to be worsening over the past few weeks. Also feeling presyncopal earlier today. Having some diarrheal episodes over the past few days. Also intermittent constipation using medications, laxatives, to help him with constipation. Poor p.o. intake, not eating well for the past few weeks or longer, and chronic Foley catheter placement for the past 2 years. CONSTITUTIONAL:  Denies any high fevers, weight loss or gain.  EYES: Denies any blurry vision, glaucoma or cataracts. ENT: Denies any tinnitus, allergies, epistaxis, sinus pain, dentures or difficulty swallowing. RESPIRATORY: Denies any hemoptysis, asthma or COPD. CARDIOVASCULAR: Denies any arrhythmias, palpitations.  GASTROINTESTINAL: Denies nausea, vomiting, rectal bleeding or change in bowel habits.  GENITOURINARY: Denies for dysuria, hematuria, frequency or incontinence. ENDOCRINOLOGY: Denies any polydipsia, nocturia, thyroid problems, heat or cold intolerance or thirst. HEMATOLOGIC:  Denies any anemia, easy bruising, bleeding or swollen glands. SKIN: Denies any acne, rashes, lesions or change in moles. MUSCULOSKELETAL: Denies arthritis, cramps, swelling or gout.  NEUROLOGIC:  Denies numbness, epilepsy or tremors.  PSYCHIATRIC: Denies anxiety, insomnia or depression.   PHYSICAL EXAMINATION: VITAL SIGNS: On arrival to the hospital, temperature is 97.5, pulse 81, respiratory rate was 16, blood pressure 92/55 and saturation was 94% on oxygen therapy.  GENERAL: This is a well-developed, well-nourished pale Caucasian male in no significant distress lying on  the stretcher. HEENT:  Pupils are equal and reactive to light. Extraocular movements intact.  No icterus or conjunctivitis.  Has normal hearing. No pharyngeal erythema. Mucosa is moist.  NECK: No masses. Supple and nontender. Thyroid not enlarged. No adenopathy. The patient does have some mild JVD. No carotid bruits bilaterally. Full range of motion.  LUNGS: Clear to auscultation in upper fields, however crackles in the bases.  HEART:  S1 and S2 appreciated. The patient does have 4/6 systolic murmur heard in mitral auscultation site with some blowing sound. Chest is nontender to palpation. Well-healed midsternal scar was noted.  Diminished pedal pulses. No significant lower extremity edema, calf tenderness or cyanosis was noted.  ABDOMEN: Soft and nontender. Bowel sounds are present. No hepatosplenomegaly or masses were noted.  RECTAL: Deferred.  MUSCLE STRENGTH: Able to move all extremities. No cyanosis, degenerative joint disease or kyphosis was noted.  SKIN: Did not reveal any rashes, lesions, erythema, nodularity or induration. It was warm and dry to palpation.  LYMPHATIC: No adenopathy in the cervical region.  NEUROLOGICAL: Cranial nerves grossly intact. Sensory is intact. No dysarthria or aphasia. The patient is alert and oriented to time, person and place, cooperative. Memory is good. No significant confusion, agitation or depression noted.   LABORATORY AND DIAGNOSTICS: BMP showed glucose of 134. BUN and  creatinine were 30 and 1.75, sodium 125. Otherwise BMP was unremarkable. The patient's B-type natriuretic peptide was 13,760.  The patient's liver enzymes were unremarkable. Cardiac enzymes, first set, showed normal CK total as well as MB fraction, however mildly elevated troponin to 0.07. White blood cell count is 9.7, hemoglobin 10.9 and platelet count 176. Urinalysis showed clear, straw-colored urine,  negative for glucose, bilirubin or ketones, specific gravity was 1.008, pH was 7.0, negative  for blood, protein, nitrites, 2+ leukocyte esterase, less than 1 red blood cell, 4 white blood cells, trace bacteria, less than 1 epithelial cell was noted as well as mucus was present.   Chest x-ray revealed cardiomegaly as well as congestive heart failure. EKG showed atrial fibrillation at 72 beats per minute, ST depressions in lateral leads. No acute ST-T changes were noted.   ASSESSMENT AND PLAN: 1.  Generalized weakness, possibly related to hypotension. Treat hypotension and get physical therapist involved and will make decisions about discharge.  2.  Hypotension. Will be holding the patient's blood pressure medications. The patient was given bolus of IV fluids here in the hospital, however, will not be giving him anymore IV fluids.  3.  Congestive heart failure, acute on chronic diastolic and systolic combined.  Will be holding the patient's diuretics as well as Coreg.  Will not be able to initiate ACE inhibitor due to renal failure and hypotension. We will get cardiologist involved.  4.  Elevated troponin, likely demand ischemia due to hypotension.  As above, cardiology consultation will be requested. Will continue the patient on aspirin as well as heparin sub-Q. 5.  History of atrial fibrillation, seems to be rate controlled.  Unfortunately, will not be able to use any rate controlling agents at this time unless digoxin will be used by cardiology and at this moment his heart rate is 70s and will not need to initiate any of them.  6.  History of hypertension. The patient is hypotensive at this time.  7.  History of obesity. The patient's weight seems to be stable.   TIME SPENT: 50 minutes. ____________________________ Katharina Caper, MD rv:sb D: 02/25/2013 12:08:03 ET T: 02/25/2013 12:59:02 ET JOB#: [id number]  cc: Katharina Caper, MD, <Dictator> Vada Swift MD ELECTRONICALLY SIGNED 02/25/2013 21:32

## 2015-01-15 NOTE — Discharge Summary (Signed)
PATIENT NAME:  [name], [name] MR#:  [id number] DATE OF BIRTH:  Nov 07, 1933  DATE OF ADMISSION:  02/26/2013 DATE OF DISCHARGE:  02/28/2013  DISCHARGE DIAGNOSES: 1. Congestive heart failure, acute systolic plus diastolic heart failure.  2. Weakness due to hypotension.  3. Chronic renal failure.  4. Diabetes.  5. Hyponatremia.   CODE STATUS: No code, do not resuscitate.   CONDITION ON DISCHARGE: Stable.   MEDICATIONS ON DISCHARGE:  1. Aspirin 81 mg once a day.  2. Levalbuterol nebulizer every 6 hours as needed for chest congestion. 3. Glimepiride 2 mg oral tablet once a day.  4. Simvastatin 10 mg oral tablet once a day.  5. Morphine 15 mg 12-hour tablet extended-release 2 times a day. 6. Lorazepam 0.5 mg oral tablet take 1 to 2 tablets every 6 to 8 hours as needed. 7. Roxanol concentrated solution take 0.5 to 1 mL orally every 1 to 2 hours as needed for shortness of breath and pain.   HOME HEALTH SERVICES: Advised to resume hospice services at home on discharge.   HOME OXYGEN: Yes. Advised to use 4 liters nasal cannula oxygen supplementation.   DIET ON DISCHARGE: Low sodium, carbonate controlled, ADA diet. Diet consistency regular.   ACTIVITY LIMITATION: As tolerated.   TIMEFRAME TO FOLLOWUP: Within 1 to 2 weeks [facility] [place].   HISTORY OF PRESENTING ILLNESS: A 81 year old Caucasian male with past medical history of multiple medical problems, including ischemic as well as valvular cardiomyopathy with ejection fraction 45% to 50%, severe tricuspid regurgitation, home oxygen 7 liters through nasal cannula at home, who presented to the hospital with complaint of weakness and inability to walk. He was doing well up until the previous day. Was also having some chest pain. The pain was described as achiness that lasted approximately 10 minutes with no radiation, and the patient also noticed some significant shortness of breath and was weak, was not able  to walk properly, so decided to come to hospital.   HOSPITAL COURSE AND STAY:  1. Generalized weakness which was noted to be due to hypotension. He was given IV fluid bolus, and he responded to that with improvement in his respiratory status also with that. We held his diuretics and Coreg, and he felt fine after IV fluid replacement.  2. Congestive heart failure, acute on chronic failure, systolic and diastolic failure. ACE inhibitor was not given due to renal failure, and diuretics were not given due to hypotension. Cardiology consult was done. They advised no further evaluation, and we just monitored him watchfully, and the patient started feeling a little better after blood pressure came up.  3. Elevated troponin. We continued aspirin, and cardiac consult was done, and this is likely demand ischemia and no acute coronary syndrome, so no further workup was advised.  4. Atrial fibrillation, was chronic, rate control was already there, so we just stopped his Coreg, and heart rate remained under control.  5. Obesity. Weight was stable.  6. Bradycardia. TSH was high, but TSH function was normal, and after his Coreg, he remained fine.  7. Overall weakness. PT consult was done, and they suggested rehab, but social worker found that the patient will be having some co-pay for that, and the patient decided not to go for rehab due to co-pay because he did not have appropriate funds to pay for it, and he would like to continue his home hospice services, so we discharged him home with his services to be resumed.  CONSULTATIONS IN THE HOSPITAL: Dr. Harold Hedge for cardiology   LABORATORY RESULTS IN THE HOSPITAL: Urinalysis positive with 4 WBCs and 2+ leukocyte esterase. Creatinine 1.75 on presentation. Hemoglobin was 10.9. Chest x-ray was showing stable cardiomegaly with COPD and possible perihilar edema versus no edematous infiltrate. Followup PA and lateral was recommended. Creatinine came up to 1.9, and then  later on came down to 1.58 on followup, which seems to be his baseline.   TOTAL TIME SPENT ON THIS DISCHARGE: 45 minutes.   ____________________________ Hope Pigeon Elisabeth Pigeon, MD vgv:OSi D: 03/04/2013 01:16:22 ET T: 03/04/2013 07:44:33 ET JOB#: [id number]  cc: Hope Pigeon. Elisabeth Pigeon, MD, <Dictator> Jimmie Molly. Candelaria Stagers, MD Altamese Dilling MD ELECTRONICALLY SIGNED 03/10/2013 22:09

## 2015-01-15 NOTE — Discharge Summary (Signed)
PATIENT NAME:  [name], [name] MR#:  [id number] DATE OF BIRTH:  07-Jun-1934  DATE OF ADMISSION:  03/15/2013 DATE OF DISCHARGE:  03/18/2013  CONSULTANTS: Dr. Lady GaryFath from cardiology and palliative care.   PRIMARY CARE PHYSICIAN: He does not have a PCP and follows with hospice at home.   CHIEF COMPLAINT: Weakness.   DISCHARGE DIAGNOSES:  1. Shortness of breath, likely multifactorial, with acute on chronic systolic congestive heart failure and severe pulmonary hypertension in the setting of chronic respiratory failure.  2. Chronic respiratory failure, on 4 to 7 liters of oxygen.  3. Hyperkalemia.  4. Positive troponin, likely demand ischemia.  5. Chronic kidney disease, stage III.  6. History of ischemic dilated and valvular cardiomyopathy, ejection fraction of 45% to 50%, with severe tricuspid regurgitation.  7. History of coronary artery disease, status post coronary artery bypass graft.  8. Morbid obesity.  9. Diabetes.  10. Hypertension.  11. Hyperlipidemia.  12. Benign prostatic hypertrophy with chronic indwelling Foley catheter.  13. Chronic atrial fibrillation.  14. History of cellulitis.  15. History of multiple falls, under hospice care.  16. Acute on chronic hyponatremia.  DISCHARGE MEDICATIONS:  1. Aspirin 81 mg daily. 2. Levalbuterol 0.63 mg/3 mL inhaled solution 1 vial via nebulizer every 6 hours as needed for congestion.  3. Glimepiride 2 mg once a day.  4. Lorazepam 0.5 mg 1 to 2 tabs every 6 to 8 hours as needed.  5. Roxanol 20 mg per mL concentrate 0.5 to 1 mg every 1 to 2 hours as needed for shortness of breath or pain.  6. Morphine 30 mg per 12 hours 1 tab every 12 hours.  7. Loratadine 10 mg once a day in the morning. 8. MiraLax 17 grams once a day.  9. Carvedilol 3.125 mg 2 times a day. 10. Lasix 40 mg tablet 1 tab once a day as needed for shortness of breath   DISPOSITION: Home with hospice.   HOME OXYGEN: 5 liters of oxygen via nasal cannula.   DIET: Low  sodium, ADA diet.   ACTIVITY: As tolerated.   DISCHARGE INSTRUCTIONS: Please take Lasix tab per  day 1 tab if weight increases by 2 or 3 pounds in a day or over 5 pounds in a few days.   CODE STATUS: The patient is DNR.   SIGNIFICANT LABORATORIES AND IMAGING: Initial BUN 22, creatinine 1.76, sodium 122, potassium 6, AST 75, ALT 85. Troponin initially was 0.64, then 0.77, next 0.56. CK-MB was within normal limits x3. Initial WBC 10.5, hemoglobin 11.6, platelets 153. Initial INR 1.2. UA had 2+ leukocyte esterase, 10 WBCs, trace bacteria. ABG showed pH of 7.32, pO2 of 54, pCO2 of 42. Last creatinine was 1.48, last sodium 123, last potassium was 4.5. Chest, PA and lateral x-ray showed interstitial infiltrate, likely representing pulmonary edema. No focal consolidation.  HISTORY OF PRESENT ILLNESS AND HOSPITAL COURSE: For full details of H and P, please see the dictation on June 21 by Dr. Jacques NavyAhmadzia, but briefly, this is a 81 year old male with recent hospitalizations, who is under hospice services at home, with recent NSTEMI, with severe respiratory failure, severe pulmonary hypertension, CKD, CHF, who was brought in for shortness of breath, weakness and chest pain. He had multiple lab abnormalities, including potassium of 6, worsening sodium and increased lower extremity edema and was admitted to the hospitalist service for further care. In regards to his weakness, this was likely secondary to deconditioning and worsening respiratory issues and has been declining for a few months.  He did have a positive troponin, likely demand ischemia in the setting of worsening respiratory status and acute on chronic CHF, systolic in nature, likely class IV NYHA. He has overall a very poor prognosis and has been taken care of by hospice and is not on diuretics. He was given gentle diuresis daily for several days with good urine output. He does have chronic indwelling Foley, and his urine output was measured. His shortness of  breath did improve with Lasix and morphine. Cardiology was consulted for his positive troponins. Given his overall poor prognosis, medical management was recommended. He is chest pain free. Palliative care was consulted. The patient was urged to consider Hospice Home for end of life as the patient is terminal at this point, and family and patient are aware of his extremely poor prognosis, but the family elected to take him back home with hospice continuation. At this point, his hyperkalemia has improved. He is less short of breath and weak. Getting discharged to home, and he is DNR.   TOTAL TIME SPENT: 35 minutes.   ____________________________ Krystal Eaton, MD sa:OSi D: 03/19/2013 08:44:35 ET T: 03/19/2013 09:33:13 ET JOB#: [id number]  cc: Krystal Eaton, MD, <Dictator> Krystal Eaton MD ELECTRONICALLY SIGNED 04/04/2013 14:58

## 2015-01-15 NOTE — Consult Note (Signed)
PATIENT NAME:  [name], [name] MR#:  [id number] DATE OF BIRTH:  12/31/1933  DATE OF CONSULTATION:  01/31/2013  REFERRING PHYSICIAN:  Dr. Luberta MutterKonidena CONSULTING PHYSICIAN:  Lamar BlinksBruce J. Jessalynn Mccowan, MD  REASON FOR CONSULTATION:  Episode of syncope with known coronary artery disease, aortic valve stenosis, chronic congestive heart failure, diabetes, chronic atrial fibrillation, chronic kidney disease and myocardial infarction.   CHIEF COMPLAINT:  "I fell."   HISTORY OF PRESENT ILLNESS:  This is an elderly male with known cardiovascular disease status post coronary artery bypass graft and aortic valve disease with known chronic cardiomyopathy with a low ejection fraction, diabetes, chronic atrial fibrillation, chronic kidney disease with a urinary tract infection.  The patient has had an episode where he stood up, was weak, dizzy and fell on his face, had contusion to his face, but now feels much better.  He appears to be dehydrated at this time, but has no evidence of rhythm disturbances other than his typical chronic atrial fibrillation and there is no evidence that the patient has had any sick sinus syndrome at this time.  The telemetry shows a controlled ventricular rate at 70 beats per minute.  The patient does have known chronic kidney disease which may be exacerbated by his severe cardiomyopathy, recent elevated troponin is consistent with a  and subendocardial myocardial infarction, but no acute ST elevation MI.  The patient has had valvular heart disease with a murmur consistent with aortic valve stenosis at this time.   REVIEW OF SYSTEMS:  The remainder review of systems negative for vision change, ringing in the ears, hearing loss, cough, congestion, heartburn, nausea, vomiting, diarrhea, bloody stools, stomach pain, extremity pain, leg weakness, cramping of the buttocks, known blood clots, headaches, nosebleeds, congestion, trouble swallowing, frequent urination, urination at night, muscle weakness,  numbness, anxiety, depression, skin lesions, skin rashes.   PAST MEDICAL HISTORY: 1.  Bypass surgery.  2.  Aortic stenosis.  3.  Chronic dilated cardiomyopathy.  4.  Diabetes.  5.  Atrial fibrillation.  6.  Chronic kidney disease.   FAMILY HISTORY:  No family members with early onset of cardiovascular disease or hypertension.   SOCIAL HISTORY:  Currently denies alcohol or tobacco use.   MEDICATIONS:  As listed.   ALLERGIES:  As listed.   PHYSICAL EXAMINATION: VITAL SIGNS:  Blood pressure is 100/60 bilaterally, heart rate is 78 upright, reclining, and irregular.  GENERAL:  He is a well-appearing male in no acute distress.  HEAD, EYES, EARS, NOSE AND THROAT:  No icterus, thyromegaly, ulcers, but has a contusion on his face.  CARDIOVASCULAR:  Irregularly irregular with normal S1, soft S2 with a 3/6 right upper sternal border murmur radiating throughout into the carotids.  PMI is diffuse.  Carotid upstroke normal without bruit.  Jugular venous pressure is normal.  LUNGS:  Have bibasilar crackles and expiratory wheezes.  ABDOMEN:  Soft, nontender, without hepatosplenomegaly or masses.  Abdominal aorta cannot be felt.  EXTREMITIES:  Showed 2+ radial, trace femoral, dorsal pedal pulses with trace to 1+ lower extremity edema.  No cyanosis, clubbing, or ulcers.  NEUROLOGIC:  He is oriented to time, place, and person with normal mood and affect.   ASSESSMENT:  Elderly male with cardiovascular disease, bypass surgery, aortic valve stenosis, chronic kidney disease, diabetes, chronic atrial fibrillation with a urinary tract infection, hypotension and possible syncope due to dehydration with subendocardial myocardial infarction due to these issues.   RECOMMENDATIONS: 1.  Followed closely with serial ECG and enzymes to assess extent of myocardial  infarction.  2.  Echocardiogram for extent of LV systolic dysfunction, extent of valvular heart disease causing possible syncope.  3.  Avoid  anticoagulation at this time due to significant fall risk and concerns of bleeding complications after a large contusion of face and head.  4.  Continue heart rate control with appropriate medications including beta-blocker as necessary with a heart rate between 60 and 90 beats per minute.  5.  Follow chest x-ray for possible onset of congestive heart failure after hydration.  6.  Urinary tract infection.  Treatment with antibiotics.  7.  Begin ambulation, following for any further significant symptoms and/or issues and adjustments of medications as necessary.     ____________________________ Lamar Blinks, MD bjk:ea D: 01/31/2013 17:24:19 ET T: 02/01/2013 00:09:41 ET JOB#: [id number]  cc: Lamar Blinks, MD, <Dictator> Lamar Blinks MD ELECTRONICALLY SIGNED 02/03/2013 13:47

## 2015-01-15 NOTE — H&P (Signed)
PATIENT NAME:  [name], Irvan MR#:  [id number] DATE OF BIRTH:  Jun 11, 1934  DATE OF ADMISSION:  01/31/2013  PRIMARY CARE PHYSICIAN: Dr.Don chaplin  in the past, but for the last 2 years, none.   REFERRING PHYSICIAN: Dr. Theressa MillardWilliam Jonathan.   CHIEF COMPLAINT: Syncopal episode.   HISTORY OF PRESENT ILLNESS: The patient is a 81 year old male with past medical history of coronary artery disease, coronary artery bypass graft, chronic systolic congestive heart failure, atrial fibrillation, morbid obesity, type 2 diabetes, hypertension, hyperlipidemia, benign prostatic hypertrophy, who is on home hospice. This is due to his terminal feature for the last 2 years and doing okay. Able to manage his day to day activities and visiting nurse gives him the medication and prescription required.  For the last 2 to 3 days, he has been feeling weak, which is getting worse.  Last night around 2:00 to 3:00 a.m. when he got up to go to the bathroom, he felt severely lead dizzy and fell down on the floor, hitting his face on the floor. He says that he never loses consciousness. He did not have any palpitations or vertigo episode. This is just because of  weakness. He fell down. He is not eating or drinking enough for the last 4 to 5 days. Denies any fever, cough or shortness of breath. The patient has chronic Foley due to his prostatic hypertrophy and hospice nurse changes it every month. Denies any urinary symptoms. Denies any recent change in the medication.   REVIEW OF SYSTEMS: Negative for fever, fatigue or generalized weakness. No weight loss.   EYES: No blurring or double vision, redness or any discharge.   EARS, NOSE, THROAT: No tinnitus, ear pain or hearing loss.   RESPIRATORY: No cough, wheezing, hemoptysis or dyspnea.   CARDIOVASCULAR: No chest pain or orthopnea.  Has edema on both legs, more on the left. No arrhythmia or palpitations.   GASTROINTESTINAL: No nausea, vomiting, diarrhea, abdominal pain. Has  some constipation complaint.   GENITOURINARY: Chronic Foley. Denies any change in urinary habits or color.   ENDOCRINE: No heat or cold intolerance on skin.  No acne or rashes on the skin, but has some injury marks and superficial laceration on right frontal area of his head and on the nasal bridge.   MUSCULOSKELETAL: Denies any swelling or tenderness of the joints.   NEUROLOGICAL: No numbness, weakness, tremors or headache.   PSYCHIATRIC: No anxiety, insomnia or depression.   PAST MEDICAL HISTORY:  Chronic systolic congestive heart failure, ejection fraction 25% to 30% and echocardiogram 3 years ago in system.   CONSTITUTIONAL: 1. Ischemic and valvular cardiomyopathy.  2. Atherosclerotic cardiovascular disease status post coronary artery bypass graft.  3. Morbid obesity.  4. Type 2 diabetes.  5. Benign hypertension.  6. Hyperlipidemia.  7. Benign prosthetic hypertrophy.  8. Chronic atrial fibrillation.  9. History of right lower extremity cellulitis.  10. Previous tobacco abuse.  ALLERGIES: SULFA.   SOCIAL HISTORY: No tobacco abuse currently.  No history of alcohol and the patient is  on hospice services for the last 2 years using oxygen at home and chronic Foley.   FAMILY HISTORY: Daughter has coronary artery disease and diabetes.   HOME MEDICATIONS: Acetaminophen 650 mg oral tablet extended-release every eight hours as needed, aspirin enteric-coated 81 mg once a day, bumetanide 1 mg oral tablet 3 times a day, carvedilol 3.125 mg oral tablet 2 times a day, ciprofloxacin 500 mg oral tablet take 0.5 tablets once a day, glimepiride  2  mg oral tablet once a day, levalbuterol 0.63 mL inhalation solution every 6 hours as needed for congestion, polyethylene glycol oral powder for reconstitution 17 grams drink orally once a day, Roxanol 20 mg/mL concentrated solution take 2.5 to 5 mg orally every for hour for severe shortness of breath and Spironolactone 100 mg oral tablet 2 times a day.    PHYSICAL EXAMINATION:  VITAL SIGNS: In the ER, temperature 98, pulse rate 77, respirations 20, blood pressure 84/47, and pulse oximetry 84 on room air with 96% 3 to 4 liters nasal cannula supplementation.   GENERAL: He is fully alert, oriented to time, place and person. No acute distress.  Obese.    HEAD AND NECK:  There is a laceration on right frontal area of the head and on the nasal bridge.  Reddening of the skin present due to fall. Neck supple. No JVD. There is hearing intact. Using nasal cannula oxygen supplementation.   RESPIRATORY: Bilateral clear and equal air entry.   CARDIOVASCULAR: S1, S2 present. Irregular systolic murmur present.   ABDOMEN: Soft, nontender. Bowel sounds present. No organomegaly or bruits.   SKIN: No rashes. Chronic changes of edema and fluid retention on both legs, more on the left side.  Edema on the legs, more on the left compared to the right.   NEUROLOGICAL: Power 4/5 in all four limbs. Follows commands. No tremor.   PSYCHIATRIC: Does not appear in any acute psychiatric illness.  LABORATORY DATA:  Glucose 137, BUN 45, creatinine 2.16, sodium 125, potassium 4.7, chloride 94, CO2 21, calcium 8.5, total protein 7.7, albumin 3.4, bilirubin 0.5. Troponin 1.5. WBC 15.9, hemoglobin 10.7, platelet count 174. Urinalysis; yellow, hazy, 3+ leukocyte esterase, WBC 5, bacteria 1+.  Doppler study of lower extremity, no evidence of deep vein thrombosis. Chest x-ray, portable, shows cardiomegaly without definite effusion or infiltrate or pneumothorax.   EKG: Atrial fibrillation with some mild ST depression chest leads.   ASSESSMENT AND PLAN: A 81 year old male with a past medical history of coronary artery bypass grafting, atrial fibrillation, cerebral vascular accident, systolic congestive heart failure on hospice services due to that, came after fall and found having hyponatremia, acute renal failure and elevated troponin. 1. Non-ST elevation myocardial infarction  this might be the cause of his fall and syncopal episode or this elevation might be secondary to dehydration, but troponin is more than 1, so I would prefer to start him on heparin drip, follow serial troponin. Monitor him on telemetry. No beta blocker due to borderline low blood pressure and cardiology consult for further management. Currently, heart rate is under control so does not need any additional therapy for that.  2. Acute renal failure. This is acute on chronic.  Baseline creatinine is 1.4 to 1.5 as per previous records.  He has decreased oral intake for last few days, may be due to urine infection. We will give gentle hydration as he is cerebral vascular accident/congestive heart failure, do not want to over-hydrate him and recheck tomorrow morning.  3. Urinary tract infection.  Presence of 5 WBCs in the urine but nitrite is positive and leukocyte esterase is positive, and these are more in favor of having urine infection with elevated WBC count in the blood.  He has chronic Foley.  We will do urine culture and give him Rocephin IV for now.  4. Hyponatremia this might be also a contributor to his weakness and fall.  He is taking diuretics and this might be the reason calling him hyponatremic.  Will hold it for now, and give him normal saline gentle hydration and check tomorrow morning.  5. Severe systolic congestive heart failure, chronic. There is no acute signs of exacerbation.  At this time his leg swellings are chronic. There is no pulmonary edema or crepitation on examination.  We will give gentle hydration due to renal failure. Slight hypotension and urinary tract infection and monitor for any fluid overload.  6. Diabetes. We will hold his oral antidiabetic medication and give him insulin sliding scale coverage for now. 7. CODE STATUS IS DO NOT RESUSCITATE. The patient is on hospice services at home. Healthcare power of attorney is his daughter, Bonney [employer], and her number is [phone].    Total Time Spent on this patient's admission is 60 minutes.    ____________________________ Hope Pigeon Elisabeth Pigeon, MD vgv:rw D: 01/31/2013 14:45:52 ET T: 01/31/2013 16:14:28 ET JOB#: [id number]  cc: Hope Pigeon. Elisabeth Pigeon, MD, <Dictator> Altamese Dilling MD ELECTRONICALLY SIGNED 02/06/2013 6:22

## 2015-01-15 NOTE — Discharge Summary (Signed)
PATIENT NAME:  [name], Librado MR#:  [id number] DATE OF BIRTH:  1934/07/28  DATE OF ADMISSION:  01/31/2013 DATE OF DISCHARGE:  02/02/2013  ADMISSION DIAGNOSIS: Non-ST elevation myocardial infarction.    DISCHARGE DIAGNOSES: 1.  Non-ST elevation myocardial infarction, secondary to hypotension and urinary tract infection.  2.  Urinary tract infection, secondary to chronic Foley catheter.  3.  Hypotension, resolved from dehydration 4.  Systolic heart failure, well-compensated.  5.  Acute-on-chronic renal failure due to prerenal azotemia from dehydration 6.  Hyponatremia.  7.  History of falls, with hospice.   CONSULTS: Cardiology; Dr. Gwen PoundsKowalski.   LABORATORIES AT DISCHARGE: White blood cells 11.7, hemoglobin 10.7, hematocrit 30.4, platelets are 162, sodium 132, potassium 4.4, chloride 102, bicarbonate 23, BUN 40, creatinine 1.74. Glucose is 152, troponin max is 1.50, troponin at discharge 1.10.   A 2-D echocardiogram showed an ejection fraction of 45% to 50%, with decreased left ventricular internal cavity size, severely enlarged right ventricle, mildly dilated left atrium, severely dilated right atrium, severe tricuspid regurgitation.   HOSPITAL COURSE: This is a very pleasant 81 year old male with a history of congestive heart failure; prior EF was 25%, who presented with weakness. Was found to have a non-ST elevation MI. For further details, please refer to the H and P.   1.  Non-ST MI: The patient was not placed on anticoagulation due to falls and a large contusion on his head after his fall, as per cardiology. He was seen by cardiology. He had no acute telemetry changes. His troponin did peak to 1.50 and then went to 1.10 at discharge. He had no chest pain throughout this hospitalization. His echo showed an EF of 45% to 50%, which is improved since his last previous echocardiogram, with no wall motion abnormalities. He does have severe diastolic dysfunction and pulmonary hypertension. We  optimized medical management, including beta blocker, aspirin, and statin.  2.  Systolic heart failure, which is well-compensated. The patient is on chronic O2 at home.  3. Acute-on-chronic renal failure: His baseline creatinine is reported at 1.6 to 1.7. This is secondary to dehydration. It did improve with IV fluids.  4.  UTI: I suspect UTI due to Foley catheter, due to his elevated white blood cell count. He was on Rocephin. The patient will be discharged with antibiotics at discharge. He had no fevers while in the hospital and urine culture was not ordered.  5.  Hyponatremia from dehydration and diuretics.  6.  Falls: The patient will have home physical therapy.   DISCHARGE MEDICATIONS: 1.  Roxanol 2.5 to 5 mg q.1 h. for severe shortness of breath.  2.  Acetaminophen 650 mg q.8h., p.r.n.  3.  Aspirin 81 mg daily.  4.  Coreg 3.125 b.i.d.  5.  Levalbuterol nebulizer q.6 hours p.r.n.  6.  Glimepiride 2 mg daily.  7.  Polyethylene glycol 17 grams daily.  8.  Simvastatin 10 mg at bedtime.  9.  Ciprofloxacin 500 b.i.d. for seven days.  10.  Bumetadine 1 mg b.i.d.   Discharge with home health, PT and nurse. Monitor blood pressure. Check kidney function, making sure creatinine is less than 1.7 before starting Bumex.   The patient has a chronic Foley placed.   DISCHARGE DIET: Low sodium, ADA diet; regular consistency.   DISCHARGE ACTIVITY: No exertion or heavy lifting.   DISCHARGE REFERRAL: The patient may resume hospice, and home health care has been ordered.   CODE STATUS: The patient is a DNR status.   TIME SPENT: Approximately  35 minutes.     ____________________________ Janyth Contes. Juliene Pina, MD spm:dm D: 02/02/2013 11:29:55 ET T: 02/03/2013 07:49:37 ET JOB#: [id number]  cc: Zylen Wenig P. Juliene Pina, MD, <Dictator> Hospice Facility [facility] Jepsen P Shahla Betsill MD ELECTRONICALLY SIGNED 02/03/2013 13:41

## 2015-01-15 NOTE — Consult Note (Signed)
   Present Illness Pt is a 81 yo male wth history of atrial fibrillation, hypertension, ischemic and valvular congestive heart failure who was admitted with complaints of increasing weakness and fatigue and inabillity to ambulate due to severe shortness of breath. He presented to the er where his cxr revealed mild volume overload. His bnp was elevated and he had a small troponin elevation to 0.08. He complains of generalized weakness and pain  all over. He is somewhat better since admission. He was recently admitted with similar sympotms and echo revealed mild reduce lv funciton with ef of 50-55% with evidence of significant pulmonary hypertension with mod to severe tr and moderate mr.   Physical Exam:  GEN no acute distress, obese   HEENT PERRL, hearing intact to voice   NECK No masses   RESP normal resp effort  rhonchi  crackles   CARD Irregular rate and rhythm  Normal, S1, S2  Murmur   Murmur Systolic   Systolic Murmur Out flow   ABD no liver/spleen enlargement  normal BS  no Adominal Mass   LYMPH negative neck   EXTR negative cyanosis/clubbing, negative edema   SKIN normal to palpation   NEURO cranial nerves intact, motor/sensory function intact   PSYCH alert   Review of Systems:  Subjective/Chief Complaint weakness and shortness of breath   General: Fatigue  Weakness   Skin: No Complaints   ENT: No Complaints   Eyes: No Complaints   Neck: No Complaints   Respiratory: Short of breath   Cardiovascular: Dyspnea   Gastrointestinal: No Complaints   Genitourinary: No Complaints   Vascular: No Complaints   Musculoskeletal: No Complaints   Neurologic: No Complaints   Hematologic: No Complaints   Endocrine: No Complaints   Psychiatric: No Complaints   Review of Systems: All other systems were reviewed and found to be negative   Medications/Allergies Reviewed Medications/Allergies reviewed   EKG:  Abnormal NSSTTW changes   Interpretation atrial  fibrillation    Sulfa drugs: Rash   Impression 81 yo male with history of cad s/p cabg, history of ischemic cardiomyopathy with ef of  by echo in the past who was admitted with progressive shortness of breath, weakness and inability to ambulate. He was noted to have acute on chronic systollic congestive heart failure. CXR shows mild pulmoanry edema. EKG shows proabable afib with controlled vr. Echo done earlier this month after admission for siimilar symptoms revealed severe pulmponary hypertension. . Troponin is minially elevated and appears to be seocndary to demand ischemia and not due to an acute coronary event. He also has moderate hyponatremia which is also likely playing a role in his symptoms.   Plan 1. Continue gentle diuresis 2. Follow serum sodium 3. Oxygen therapy for his pulmonary hypertensioin 4. Elevated serum troponin is mininal and does not appear to represent an acute ischemic event. Would defer invasive cardiac evaluation at present 5. Will follow with you   Electronic Signatures: Dalia HeadingFath, Kenneth A (MD)  (Signed 03-Jun-14 20:42)  Authored: General Aspect/Present Illness, History and Physical Exam, Review of System, Home Medications, EKG , Allergies, Impression/Plan   Last Updated: 03-Jun-14 20:42 by Dalia HeadingFath, Kenneth A (MD)

## 2015-01-15 NOTE — Consult Note (Signed)
Present Illness 81 yo male with hisotry of multiple medical problems including ischemic, valvular cardiomyopathy with ef of 40-45%, history of severe tr with pulmonary hypertension, chronic afib, copd, CKD III who is in hoome hospice care who was brought to the er by  family with complaints of increasing weakness, shortness of breath adn general failure to thrive. He has evidence of mild volume overload. He has a mild troponin elevation. He is a difficult historian but complains of shortness of breath, chest pain, abdominal pain and back pain.   Physical Exam:  GEN no acute distress   HEENT PERRL, hearing intact to voice   NECK supple   RESP normal resp effort  rhonchi   CARD Irregular rate and rhythm  Murmur   Murmur Systolic   Systolic Murmur axilla   ABD denies tenderness  no Adominal Mass   LYMPH negative neck, negative axillae   EXTR negative cyanosis/clubbing, negative edema   SKIN normal to palpation   NEURO cranial nerves intact, motor/sensory function intact   PSYCH poor insight   Review of Systems:  Subjective/Chief Complaint shortness of breath, weakness, chest pain   General: Fatigue  Weakness   Skin: No Complaints   ENT: No Complaints   Eyes: No Complaints   Neck: No Complaints   Respiratory: Short of breath   Cardiovascular: Chest pain or discomfort  Dyspnea   Gastrointestinal: No Complaints   Genitourinary: No Complaints   Vascular: No Complaints   Musculoskeletal: Muscle or joint pain   Psychiatric: No Complaints   Review of Systems: All other systems were reviewed and found to be negative   Medications/Allergies Reviewed Medications/Allergies reviewed     MI - Myocardial Infarct:    CHF:    atrial fib:    Hypercholesterolemia:    Diabetes:    CABG:   Home Medications: Medication Instructions Status  ciprofloxacin 500 mg oral tablet 0.5 tab (250mg ) orally once a day x 7 days. Active  glimepiride 2 mg oral tablet 1 tab(s)  orally once a day Active  LORazepam 0.5 mg oral tablet 1-2 tab(s) orally every 6 to 8 hours, As Needed Active  Roxanol 20 mg/mL concentrate 0.5-1 milligram(s) orally every 1-2 hours, As Needed - for Shortness of Breath, for Pain  Active  morphine 30 mg/12 hr oral tablet, extended release 1 tab(s) orally every 12 hours Active  omeprazole 20 mg oral delayed release capsule 1 cap(s) orally once a day Active  loratadine 10 mg oral tablet 1 tab(s) orally once a day (in the morning) Active  polyethylene glycol 3350 - oral powder for reconstitution 17 gram(s) in 8oz of water or juice and drink orally once a day. Active  Aspirin Enteric Coated 81 mg oral delayed release tablet 1 tab(s) orally once a day Active  levalbuterol 0.63 mg/3 mL inhalation solution 1 vial (3 milliliters)via nebulizer every 6 hours as needed for chest congestion. Active   EKG:  Abnormal NSSTTW changes   Interpretation afib    Sulfa drugs: Rash   Impression Pt with history of multiple medical problems including cad, ischemic and valvular cardiomyopathy with pulmonary hypertension and tr, ckd iii, ef of 40-45%, history of chornic afib who is involved in hospice progam home who was brought to the er by his family for failure to thrive with shortness of breath, weakness , fatigue and evidene of probable mild volume overload. Has a mild troponin elevation which is likley demand ischemia. Has acute on chronic chf which appears to be mild  but nyha class iv. Poor historian. Does not appear to be a cnadidate for agressive invasive cardiac evaluaiton.   Plan 1 Contineu with medical therapy of chf and cad wiht gentle diuresis and use of asa and topical nitrates. Will attempt careful use of low dose beta blockers following heart reate and blood pressure.  2. Defer invasive cardiac evalaution at present 3. Discuss further hospice care with patient and family as there does not appear to be likelyhood of signficnat improvement   Electronic  Signatures: Dalia Heading (MD)  (Signed 22-Jun-14 14:58)  Authored: General Aspect/Present Illness, History and Physical Exam, Review of System, Past Medical History, Home Medications, EKG , Allergies, Impression/Plan   Last Updated: 22-Jun-14 14:58 by Dalia Heading (MD)
